# Patient Record
Sex: Male | Born: 2000 | Race: Black or African American | Hispanic: No | Marital: Single | Smoking: Never smoker
Health system: Southern US, Community
[De-identification: ages and names within clinical notes are randomized; demographics above are authoritative.]

## PROBLEM LIST (undated history)

## (undated) DIAGNOSIS — L509 Urticaria, unspecified: Secondary | ICD-10-CM

## (undated) HISTORY — DX: Urticaria, unspecified: L50.9

## (undated) HISTORY — PX: NO PAST SURGERIES: SHX2092

---
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
## 2020-01-17 ENCOUNTER — Other Ambulatory Visit: Payer: Self-pay | Admitting: Family Medicine

## 2020-02-18 ENCOUNTER — Other Ambulatory Visit: Payer: Self-pay | Admitting: Family Medicine

## 2020-02-18 NOTE — Telephone Encounter (Addendum)
Denied refill montelukast.  Patient needs OV.  Last ov 01/13/2019. Patient was given one refill of montelukast on 01/19/2020.  No OV made. Tried to call patient to schedule an OV.  No answer and no VM.

## 2020-05-28 ENCOUNTER — Encounter (HOSPITAL_BASED_OUTPATIENT_CLINIC_OR_DEPARTMENT_OTHER): Payer: Self-pay | Admitting: Emergency Medicine

## 2020-05-28 ENCOUNTER — Emergency Department (HOSPITAL_BASED_OUTPATIENT_CLINIC_OR_DEPARTMENT_OTHER)
Admission: EM | Admit: 2020-05-28 | Discharge: 2020-05-28 | Disposition: A | Discharge status: Home / Self Care | Payer: Medicaid Other | Attending: Emergency Medicine | Admitting: Emergency Medicine

## 2020-05-28 ENCOUNTER — Other Ambulatory Visit: Payer: Self-pay

## 2020-05-28 ENCOUNTER — Emergency Department (HOSPITAL_BASED_OUTPATIENT_CLINIC_OR_DEPARTMENT_OTHER): Payer: Medicaid Other

## 2020-05-28 DIAGNOSIS — R0789 Other chest pain: Secondary | ICD-10-CM | POA: Diagnosis present

## 2020-05-28 MED ORDER — KETOROLAC TROMETHAMINE 60 MG/2ML IM SOLN
60.0000 mg | Freq: Once | INTRAMUSCULAR | Status: AC
  Administered 2020-05-28: 60 mg via INTRAMUSCULAR
  Filled 2020-05-28: qty 2

## 2020-05-28 MED ORDER — SODIUM CHLORIDE 0.9% FLUSH
3.0000 mL | Freq: Once | INTRAVENOUS | Status: DC
  Filled 2020-05-28: qty 3

## 2020-05-28 MED ORDER — IBUPROFEN 800 MG PO TABS: 800.0000 mg | ORAL_TABLET | Freq: Once | ORAL | Status: DC

## 2020-05-28 NOTE — ED Triage Notes (Signed)
Progressively worse left sided rib/chest pain since yesterday. Denies trauma. States sudden onset while resting. Increased pain with inspiration.

## 2020-05-28 NOTE — ED Notes (Signed)
Declined IV and blood work at this time

## 2020-05-28 NOTE — ED Provider Notes (Signed)
TIME SEEN: 4:33 AM  CHIEF COMPLAINT: Left-sided chest pain  HPI: Patient is an 19 year old male who presents to the emergency department left-sided chest pain that started today.  Describes it as sharp and is uncomfortable with movement and palpation.  No shortness of breath, fevers, cough, lower extremity swelling or pain.  No history of PE, DVT, exogenous estrogen use, recent fractures, surgery, trauma, hospitalization or prolonged travel. No lower extremity swelling or pain. No calf tenderness.  Mother reports he has had this before and was told it was musculoskeletal pain.  ROS: See HPI Constitutional: no fever  Eyes: no drainage  ENT: no runny nose   Cardiovascular:   chest pain  Resp: no SOB  GI: no vomiting GU: no dysuria Integumentary: no rash  Allergy: no hives  Musculoskeletal: no leg swelling  Neurological: no slurred speech ROS otherwise negative  PAST MEDICAL HISTORY/PAST SURGICAL HISTORY:  Past Medical History:  Diagnosis Date  . Urticaria     MEDICATIONS:  Prior to Admission medications   Medication Sig Start Date End Date Taking? Authorizing Provider  Albuterol Sulfate 108 (90 Base) MCG/ACT AEPB Inhale 2 puffs into the lungs every 4 (four) hours as needed.    [provider]  cetirizine (ZYRTEC) 10 MG tablet Take 10 mg by mouth daily as needed.    [provider]  montelukast (SINGULAIR) 10 MG tablet TAKE 1 TABLET(10 MG) BY MOUTH AT BEDTIME 01/19/20   Ambs, Kathrine Cords, FNP    ALLERGIES:  No Known Allergies  SOCIAL HISTORY:  Social History   Tobacco Use  . Smoking status: Never Smoker  . Smokeless tobacco: Never Used  Substance Use Topics  . Alcohol use: No    FAMILY HISTORY: Family History  Problem Relation Age of Onset  . Allergic rhinitis Neg Hx   . Angioedema Neg Hx   . Asthma Neg Hx   . Eczema Neg Hx   . Immunodeficiency Neg Hx   . Urticaria Neg Hx     EXAM: BP 128/85 (BP Location: Left Arm)   Pulse (!) 53   Temp 98.1 F  (36.7 C)   Resp 18   Ht 5\' 8"  (1.727 m)   SpO2 100%  CONSTITUTIONAL: Alert and oriented and responds appropriately to questions. Well-appearing; well-nourished HEAD: Normocephalic EYES: Conjunctivae clear, pupils appear equal, EOM appear intact ENT: normal nose; moist mucous membranes NECK: Supple, normal ROM CARD: RRR; S1 and S2 appreciated; no murmurs, no clicks, no rubs, no gallops CHEST:  Chest wall is tender to palpation over the left anterior chest wall which reproduces his pain.  No crepitus, ecchymosis, erythema, warmth, rash or other lesions present.   RESP: Normal chest excursion without splinting or tachypnea; breath sounds clear and equal bilaterally; no wheezes, no rhonchi, no rales, no hypoxia or respiratory distress, speaking full sentences ABD/GI: Normal bowel sounds; non-distended; soft, non-tender, no rebound, no guarding, no peritoneal signs, no hepatosplenomegaly BACK:  The back appears normal EXT: Normal ROM in all joints; no deformity noted, no edema; no cyanosis SKIN: Normal color for age and race; warm; no rash on exposed skin NEURO: Moves all extremities equally PSYCH: The patient's mood and manner are appropriate.   MEDICAL DECISION MAKING: Patient here with chest wall pain.  Doubt ACS.  EKG reviewed/interpreted and is reassuring without signs of ischemia.  He is PERC negative.  Chest x-ray reviewed/interpreted and shows no pneumothorax, rib fracture, edema, consolidation.  Doubt dissection.  Doubt pericarditis, myocarditis, endocarditis.  Given Toradol here and  appears more comfortable.  Recommended alternating ibuprofen and Tylenol at home.  Recommended decreased exercise over the next several days until symptoms have improved.  Mother suspects that working out, push-ups likely contributed to his discomfort.  At this time, I do not feel there is any life-threatening condition present. I have reviewed, interpreted and discussed all results (EKG, imaging, lab, urine as  appropriate) and exam findings with patient/family. I have reviewed nursing notes and appropriate previous records.  I feel the patient is safe to be discharged home without further emergent workup and can continue workup as an outpatient as needed. Discussed usual and customary return precautions. Patient/family verbalize understanding and are comfortable with this plan.  Outpatient follow-up has been provided as needed. All questions have been answered.     EKG Interpretation  Date/Time:  Friday May 28 2020 03:56:02 EDT Ventricular Rate:  62 PR Interval:  148 QRS Duration: 94 QT Interval:  390 QTC Calculation: 395 R Axis:   85 Text Interpretation: Normal sinus rhythm with sinus arrhythmia Early repolarization Normal ECG No old tracing to compare Confirmed by Aashi Derrington, Baxter Hire [id number]) on 05/28/2020 4:07:39 AM          Mariana Kaufman Kimmons was evaluated in Emergency Department on 05/28/2020 for the symptoms described in the history of present illness. She was evaluated in the context of the global COVID-19 pandemic, which necessitated consideration that the patient might be at risk for infection with the SARS-CoV-2 virus that causes COVID-19. Institutional protocols and algorithms that pertain to the evaluation of patients at risk for COVID-19 are in a state of rapid change based on information released by regulatory bodies including the CDC and federal and state organizations. These policies and algorithms were followed during the patient's care in the ED.      Icelyn Navarrete, Layla Maw, DO 05/28/20 [phone]

## 2020-05-28 NOTE — Discharge Instructions (Addendum)
You may alternate Tylenol 1000 mg every 6 hours as needed for pain and Ibuprofen 800 mg every 8 hours as needed for pain.  Please take Ibuprofen with food.  Do not take more than 4000 mg of Tylenol (acetaminophen) in a 24 hour period.  

## 2021-03-23 ENCOUNTER — Encounter (HOSPITAL_BASED_OUTPATIENT_CLINIC_OR_DEPARTMENT_OTHER): Payer: Self-pay | Admitting: Emergency Medicine

## 2021-03-23 ENCOUNTER — Emergency Department (HOSPITAL_BASED_OUTPATIENT_CLINIC_OR_DEPARTMENT_OTHER)
Admission: EM | Admit: 2021-03-23 | Discharge: 2021-03-23 | Disposition: A | Discharge status: Home / Self Care | Payer: Medicaid Other | Attending: Emergency Medicine | Admitting: Emergency Medicine

## 2021-03-23 ENCOUNTER — Other Ambulatory Visit: Payer: Self-pay

## 2021-03-23 DIAGNOSIS — J039 Acute tonsillitis, unspecified: Secondary | ICD-10-CM | POA: Insufficient documentation

## 2021-03-23 DIAGNOSIS — R070 Pain in throat: Secondary | ICD-10-CM | POA: Diagnosis present

## 2021-03-23 DIAGNOSIS — J45909 Unspecified asthma, uncomplicated: Secondary | ICD-10-CM | POA: Insufficient documentation

## 2021-03-23 MED ORDER — CLINDAMYCIN HCL 300 MG PO CAPS: 300.0000 mg | ORAL_CAPSULE | Freq: Four times a day (QID) | ORAL | 0 refills | Status: AC

## 2021-03-23 MED ORDER — IBUPROFEN 400 MG PO TABS
400.0000 mg | ORAL_TABLET | Freq: Once | ORAL | Status: AC
  Administered 2021-03-23: 400 mg via ORAL
  Filled 2021-03-23: qty 1

## 2021-03-23 MED ORDER — CLINDAMYCIN HCL 150 MG PO CAPS
300.0000 mg | ORAL_CAPSULE | Freq: Once | ORAL | Status: AC
  Administered 2021-03-23: 300 mg via ORAL
  Filled 2021-03-23: qty 2

## 2021-03-23 NOTE — ED Notes (Signed)
Patient complains of sore throat for about two weeks.  CO difficulty swallowing.

## 2021-03-23 NOTE — ED Triage Notes (Signed)
Patient arrived c/o sore throat x 2 weeks intermittently. Patient states that it hurts to swallow. Patient is AO x 4, VS WDL, normal gait.

## 2021-03-23 NOTE — ED Provider Notes (Signed)
[facility] EMERGENCY DEPARTMENT Provider Note   CSN: [id number] Arrival date & time: 03/23/21  0008     History Chief Complaint  Patient presents with  . Sore Throat    Walter Donovan is a 20 y.o. adult.  Has been on Magic mouthwash and doxycycline however threw up the doxycycline.   Sore Throat This is a new problem. The current episode started more than 1 week ago. The problem occurs constantly. The problem has been gradually worsening. Pertinent negatives include no chest pain, no abdominal pain, no headaches and no shortness of breath. Nothing aggravates the symptoms. Nothing relieves the symptoms. She has tried nothing for the symptoms.       Past Medical History:  Diagnosis Date  . Urticaria     Patient Active Problem List   Diagnosis Date Noted  . Reactive airway disease 01/13/2019  . Seasonal and perennial allergic rhinitis 08/24/2017  . 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 NO PAST SURGERIES         Family History  Problem Relation Age of Onset  . Allergic rhinitis Neg Hx   . Angioedema Neg Hx   . Asthma Neg Hx   . Eczema Neg Hx   . Immunodeficiency Neg Hx   . Urticaria Neg Hx     Social History   Tobacco Use  . Smoking status: Never Smoker  . Smokeless tobacco: Never Used  Vaping Use  . Vaping Use: Never used  Substance Use Topics  . Alcohol use: No  . Drug use: Yes    Types: Marijuana    Home Medications Prior to Admission medications   Medication Sig Start Date End Date Taking? Authorizing Provider  clindamycin (CLEOCIN) 300 MG capsule Take 1 capsule (300 mg total) by mouth 4 (four) times daily. X 7 days 03/23/21  Yes Shronda Boeh, Barbara Cower, MD  Albuterol Sulfate 108 (90 Base) MCG/ACT AEPB Inhale 2 puffs into the lungs every 4 (four) hours as needed.    [provider]  cetirizine (ZYRTEC) 10 MG tablet Take 10 mg by mouth daily as needed.    [provider]  montelukast  (SINGULAIR) 10 MG tablet TAKE 1 TABLET(10 MG) BY MOUTH AT BEDTIME 01/19/20   Ambs, Norvel Richards, FNP    Allergies    Patient has no known allergies.  Review of Systems   Review of Systems  Respiratory: Negative for shortness of breath.   Cardiovascular: Negative for chest pain.  Gastrointestinal: Negative for abdominal pain.  Neurological: Negative for headaches.  All other systems reviewed and are negative.   Physical Exam Updated Vital Signs BP (!) 136/92 (BP Location: Right Arm)   Pulse 73   Temp 98.7 F (37.1 C) (Oral)   Resp 18   Ht 5\' 8"  (1.727 m)   Wt 74.8 kg   SpO2 99%   BMI 25.09 kg/m   Physical Exam Vitals and nursing note reviewed.  Constitutional:      Appearance: She is well-developed.  HENT:     Head: Normocephalic and atraumatic.     Mouth/Throat:     Mouth: Mucous membranes are moist.     Pharynx: Oropharynx is clear.     Comments: R>L tonsillar swelling. Uvula midline. No obvious abscess.  Eyes:     Pupils: Pupils are equal, round, and reactive to light.  Cardiovascular:     Rate and Rhythm: Normal rate.  Pulmonary:     Effort: Pulmonary effort is normal.  No respiratory distress.  Abdominal:     General: There is no distension.  Musculoskeletal:        General: Normal range of motion.     Cervical back: Normal range of motion.  Skin:    General: Skin is warm and dry.  Neurological:     General: No focal deficit present.     Mental Status: She is alert.     ED Results / Procedures / Treatments   Labs (all labs ordered are listed, but only abnormal results are displayed) Labs Reviewed - No data to display  EKG None  Radiology No results found.  Procedures Procedures   Medications Ordered in ED Medications  clindamycin (CLEOCIN) capsule 300 mg (300 mg Oral Given 03/23/21 0050)  ibuprofen (ADVIL) tablet 400 mg (400 mg Oral Given 03/23/21 0050)    ED Course  I have reviewed the triage vital signs and the nursing notes.  Pertinent labs  & imaging results that were available during my care of the patient were reviewed by me and considered in my medical decision making (see chart for details).    MDM Rules/Calculators/A&P                          Suspect asymmetric tonsillitis. No drainable abscess is evident. Wide open airway.  Tolerating PO. Will advise continued Magic mouthwash, antibiotics and ENT follow-up in 3 to 5 days to ensure improvement.  Return here for any new or worsening symptoms.  Final Clinical Impression(s) / ED Diagnoses Final diagnoses:  Tonsillitis    Rx / DC Orders ED Discharge Orders         Ordered    clindamycin (CLEOCIN) 300 MG capsule  4 times daily        03/23/21 0106           Freda Jaquith, Barbara Cower, MD 03/23/21 [phone]

## 2021-10-28 ENCOUNTER — Emergency Department (HOSPITAL_BASED_OUTPATIENT_CLINIC_OR_DEPARTMENT_OTHER): Payer: Medicaid Other

## 2021-10-28 ENCOUNTER — Other Ambulatory Visit: Payer: Self-pay

## 2021-10-28 ENCOUNTER — Encounter (HOSPITAL_BASED_OUTPATIENT_CLINIC_OR_DEPARTMENT_OTHER): Payer: Self-pay

## 2021-10-28 ENCOUNTER — Emergency Department (HOSPITAL_BASED_OUTPATIENT_CLINIC_OR_DEPARTMENT_OTHER)
Admission: EM | Admit: 2021-10-28 | Discharge: 2021-10-28 | Disposition: A | Discharge status: Home / Self Care | Payer: Medicaid Other | Attending: Emergency Medicine | Admitting: Emergency Medicine

## 2021-10-28 DIAGNOSIS — D72829 Elevated white blood cell count, unspecified: Secondary | ICD-10-CM | POA: Insufficient documentation

## 2021-10-28 DIAGNOSIS — R0789 Other chest pain: Secondary | ICD-10-CM | POA: Insufficient documentation

## 2021-10-28 LAB — CBC
HCT: 44.9 % (ref 39.0–52.0)
Hemoglobin: 16.7 g/dL (ref 13.0–17.0)
MCH: 31.9 pg (ref 26.0–34.0)
MCHC: 37.2 g/dL — ABNORMAL HIGH (ref 30.0–36.0)
MCV: 85.7 fL (ref 80.0–100.0)
Platelets: 313 10*3/uL (ref 150–400)
RBC: 5.24 MIL/uL (ref 4.22–5.81)
RDW: 11.9 % (ref 11.5–15.5)
WBC: 13.2 10*3/uL — ABNORMAL HIGH (ref 4.0–10.5)
nRBC: 0 % (ref 0.0–0.2)

## 2021-10-28 LAB — BASIC METABOLIC PANEL
Anion gap: 8 (ref 5–15)
BUN: 11 mg/dL (ref 6–20)
CO2: 29 mmol/L (ref 22–32)
Calcium: 9.5 mg/dL (ref 8.9–10.3)
Chloride: 101 mmol/L (ref 98–111)
Creatinine, Ser: 1.25 mg/dL — ABNORMAL HIGH (ref 0.61–1.24)
GFR, Estimated: >60 mL/min (ref >60)
Glucose, Bld: 99 mg/dL (ref 70–99)
Potassium: 3.7 mmol/L (ref 3.5–5.1)
Sodium: 138 mmol/L (ref 135–145)

## 2021-10-28 LAB — TROPONIN I (HIGH SENSITIVITY): Troponin I (High Sensitivity): <2 ng/L (ref <18)

## 2021-10-28 MED ORDER — DEXAMETHASONE SODIUM PHOSPHATE 4 MG/ML IJ SOLN
4.0000 mg | Freq: Once | INTRAMUSCULAR | Status: AC
  Administered 2021-10-28: 4 mg via INTRAMUSCULAR
  Filled 2021-10-28: qty 1

## 2021-10-28 NOTE — Discharge Instructions (Signed)
Seems to be chest wall in nature.  Hopefully the Decadron shot will help.  In addition you can take Motrin 800 mg every 8 hours as well.  Make an appointment follow-up with sports medicine.  Work-up here today EKG heart markers chest x-ray all negative.  Return for any new or worse symptoms.

## 2021-10-28 NOTE — ED Provider Notes (Signed)
[facility] EMERGENCY DEPARTMENT Provider Note   CSN: [id number] Arrival date & time: 10/28/21  1821     History Chief Complaint  Patient presents with   Chest Pain    Walter Donovan is a 20 y.o. adult.  Patient here for left anterior chest wall pain.  Symptoms have been ongoing for about a week.  Its intermittent.  Made worse by taking deep breaths.  There is some discomfort to palpation to the area.  And sometimes with movement of the trunk but does not happen with movement of the left upper extremity.  Approximately 2 years ago had something similar happen which was improved by a steroid shot.  Patient does not have any upper respiratory type symptoms.  Patient in triage when they drew his blood did have a brief syncopal episode sounds as if it was vasovagal.  Patient feels fine now.  Patient denies any shortness of breath.  Patient denies any injuries.  Does not do any weight lifting.      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 Authorizing Provider  Albuterol Sulfate 108 (90 Base) MCG/ACT AEPB Inhale 2 puffs into the lungs every 4 (four) hours as needed.    [provider]  cetirizine (ZYRTEC)  10 MG tablet Take 10 mg by mouth daily as needed.    [provider]  clindamycin (CLEOCIN) 300 MG capsule Take 1 capsule (300 mg total) by mouth 4 (four) times daily. X 7 days 03/23/21   Mesner, Corene Cornea, MD  montelukast (SINGULAIR) 10 MG tablet TAKE 1 TABLET(10 MG) BY MOUTH AT BEDTIME 01/19/20   Ambs, Kathrine Cords, FNP    Allergies    Patient has no known allergies.  Review of Systems   Review of Systems  Constitutional:  Negative for chills and fever.  HENT:  Negative for ear pain and sore throat.   Eyes:  Negative for pain and visual disturbance.  Respiratory:  Negative for cough and shortness of breath.   Cardiovascular:  Positive for chest pain. Negative for palpitations.  Gastrointestinal:  Negative for abdominal pain and vomiting.  Genitourinary:  Negative for dysuria and hematuria.  Musculoskeletal:  Negative for arthralgias and back pain.  Skin:  Negative for color change and rash.  Neurological:  Negative for seizures and syncope.  All other systems reviewed and are negative.  Physical Exam Updated Vital Signs BP 117/67   Pulse 83   Temp 98.6 F (37 C) (Oral)   Resp 19   Ht 1.727 m (  5\' 8" )   Wt 87.5 kg   SpO2 99%   BMI 29.35 kg/m   Physical Exam Vitals and nursing note reviewed.  Constitutional:      General: She is not in acute distress.    Appearance: Normal appearance. She is well-developed.  HENT:     Head: Normocephalic and atraumatic.  Eyes:     Extraocular Movements: Extraocular movements intact.     Conjunctiva/sclera: Conjunctivae normal.     Pupils: Pupils are equal, round, and reactive to light.  Cardiovascular:     Rate and Rhythm: Normal rate and regular rhythm.     Heart sounds: No murmur heard. Pulmonary:     Effort: Pulmonary effort is normal. No respiratory distress.     Breath sounds: Normal breath sounds. No stridor. No wheezing, rhonchi or rales.     Comments: Tender to palpation actually below the pectoralis muscle anteriorly midline.   Midline but the clavicle.  Over the ribs which would kind of be the seventh or eighth rib area. Chest:     Chest wall: Tenderness present.  Abdominal:     Palpations: Abdomen is soft.     Tenderness: There is no abdominal tenderness.  Musculoskeletal:        General: Normal range of motion.     Cervical back: Normal range of motion and neck supple.  Skin:    General: Skin is warm and dry.  Neurological:     General: No focal deficit present.     Mental Status: She is alert and oriented to person, place, and time.     Cranial Nerves: No cranial nerve deficit.     Sensory: No sensory deficit.     Motor: No weakness.    ED Results / Procedures / Treatments   Labs (all labs ordered are listed, but only abnormal results are displayed) Labs Reviewed  BASIC METABOLIC PANEL - Abnormal; Notable for the following components:      Result Value   Creatinine, Ser 1.25 (*)    All other components within normal limits  CBC - Abnormal; Notable for the following components:   WBC 13.2 (*)    MCHC 37.2 (*)    All other components within normal limits  TROPONIN I (HIGH SENSITIVITY)  TROPONIN I (HIGH SENSITIVITY)    EKG EKG Interpretation  Date/Time:  Friday October 28 2021 18:55:36 EDT Ventricular Rate:  71 PR Interval:  142 QRS Duration: 86 QT Interval:  336 QTC Calculation: 365 R Axis:   86 Text Interpretation: Normal sinus rhythm Normal ECG Confirmed by 1972 [phone]) on 10/28/2021 9:23:20 PM  Radiology DG Chest Portable 1 View  Result Date: 10/28/2021 CLINICAL DATA:  Intermittent chest pain for 2 weeks. Productive cough for 5 days. EXAM: PORTABLE CHEST 1 VIEW COMPARISON:  05/28/2020 FINDINGS: The heart size and mediastinal contours are within normal limits. Both lungs are clear. The visualized skeletal structures are unremarkable. IMPRESSION: No active disease. Electronically Signed   By: 07/28/2020 M.D.   On: 10/28/2021 20:23    Procedures Procedures    Medications Ordered in ED Medications  dexamethasone (DECADRON) injection 4 mg (4 mg Intramuscular Given 10/28/21 2114)    ED Course  I have reviewed the triage vital signs and the nursing notes.  Pertinent labs & imaging results that were available during my care of the patient were reviewed by me and considered in my medical decision making (see chart for details).    MDM Rules/Calculators/A&P  Symptoms very chest wall in nature.  Patient without any hypoxia not tachycardic.  EKG normal sinus rhythm.  Initial troponin normal.  Delta troponin not really required.  Chest x-ray negative.  Seems to be chest wall in nature.  Mild leukocytosis.  Basic metabolic panel is normal.  Patient stated that the steroid injection has helped in the past.  Did not want just oral steroids.  So we will give some IM Decadron here.  Given referral to follow-up with sports medicine.  Patient will return for any new or worse symptoms.   Final Clinical Impression(s) / ED Diagnoses Final diagnoses:  Chest wall pain    Rx / DC Orders ED Discharge Orders     None        Fredia Sorrow, MD 10/28/21 2125

## 2021-10-28 NOTE — ED Notes (Signed)
Pt refused nasal swab for flu/covid-agreed to EKG, CXR , blood work

## 2021-10-28 NOTE — ED Notes (Signed)
Patient denies pain and is resting comfortably.  

## 2021-10-28 NOTE — ED Triage Notes (Signed)
Pt c/o intermittent CP x 2 weeks-prod cough x 5 days-denies fever-NAD-steady gait

## 2021-10-28 NOTE — ED Notes (Signed)
After blood draw-pt became pale, diaphoretic, brief syncopal episode-pt stood from chair to stretcher-placed in supine position-back to baseline-VSS-given water-back to w/c and to ED WR

## 2022-07-08 IMAGING — DX DG CHEST 1V PORT
1 series · 1 of 1 positions shown · non-contrast
Comparison: 05/28/2020

CLINICAL DATA: Intermittent chest pain for 2 weeks. Productive
cough for 5 days.

EXAM:
PORTABLE CHEST 1 VIEW

[chest ap]
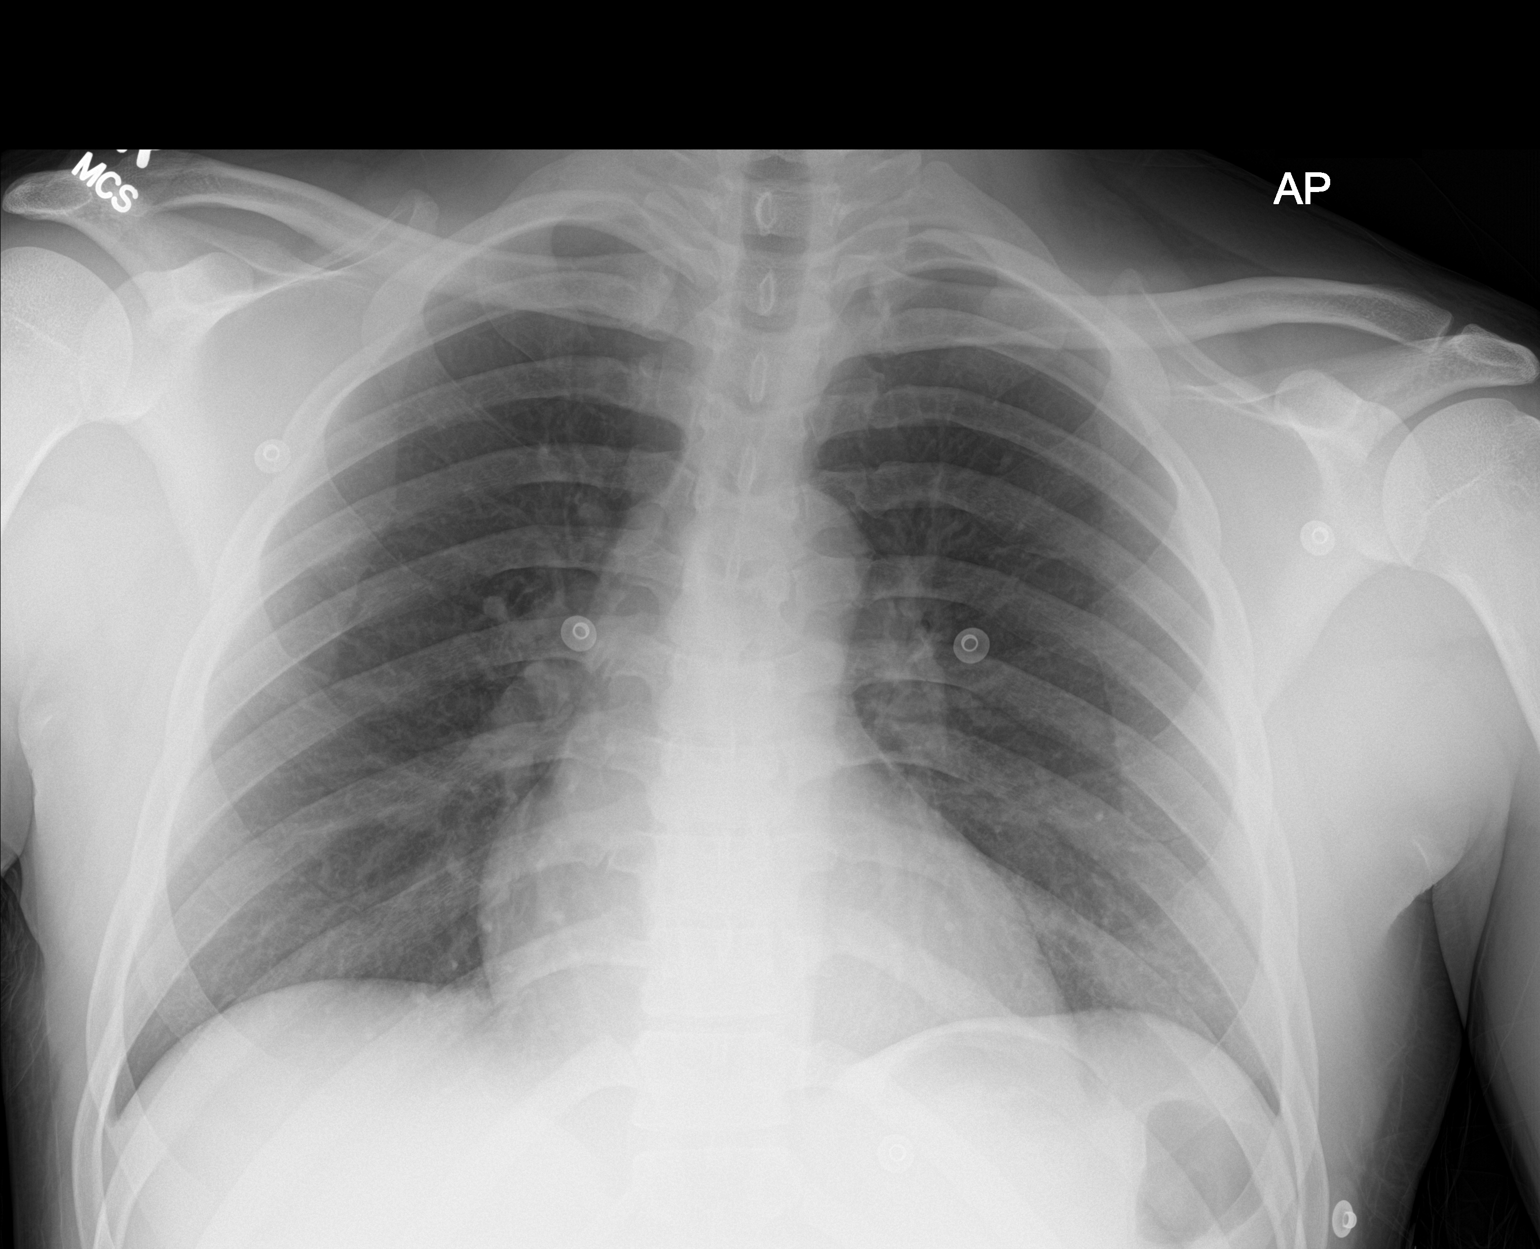

[1 of 1 positions shown; findings below may reference images not displayed]

FINDINGS: The heart size and mediastinal contours are within normal limits.
Both lungs are clear. The visualized skeletal structures are
unremarkable.
IMPRESSION: No active disease.

## 2022-09-16 ENCOUNTER — Encounter (HOSPITAL_BASED_OUTPATIENT_CLINIC_OR_DEPARTMENT_OTHER): Payer: Self-pay | Admitting: Emergency Medicine

## 2022-09-16 ENCOUNTER — Other Ambulatory Visit: Payer: Self-pay

## 2022-09-16 ENCOUNTER — Emergency Department (HOSPITAL_BASED_OUTPATIENT_CLINIC_OR_DEPARTMENT_OTHER)
Admission: EM | Admit: 2022-09-16 | Discharge: 2022-09-16 | Disposition: A | Discharge status: Home / Self Care | Payer: Medicaid Other | Attending: Emergency Medicine | Admitting: Emergency Medicine

## 2022-09-16 DIAGNOSIS — J029 Acute pharyngitis, unspecified: Secondary | ICD-10-CM | POA: Diagnosis present

## 2022-09-16 DIAGNOSIS — Z20822 Contact with and (suspected) exposure to covid-19: Secondary | ICD-10-CM | POA: Insufficient documentation

## 2022-09-16 DIAGNOSIS — J45909 Unspecified asthma, uncomplicated: Secondary | ICD-10-CM | POA: Insufficient documentation

## 2022-09-16 LAB — SARS CORONAVIRUS 2 BY RT PCR: SARS Coronavirus 2 by RT PCR: NEGATIVE

## 2022-09-16 LAB — GROUP A STREP BY PCR: Group A Strep by PCR: NOT DETECTED

## 2022-09-16 MED ORDER — DEXAMETHASONE SODIUM PHOSPHATE 10 MG/ML IJ SOLN
10.0000 mg | Freq: Once | INTRAMUSCULAR | Status: AC
  Administered 2022-09-16: 10 mg
  Filled 2022-09-16: qty 1

## 2022-09-16 MED ORDER — IBUPROFEN 400 MG PO TABS
600.0000 mg | ORAL_TABLET | Freq: Once | ORAL | Status: AC
  Administered 2022-09-16: 600 mg via ORAL
  Filled 2022-09-16: qty 1

## 2022-09-16 MED ORDER — DEXAMETHASONE SODIUM PHOSPHATE 10 MG/ML IJ SOLN: 10.0000 mg | Freq: Once | INTRAMUSCULAR | Status: DC

## 2022-09-16 NOTE — ED Provider Notes (Signed)
[facility] EMERGENCY DEPARTMENT Provider Note   CSN: [id number] Arrival date & time: 09/16/22  1116     History  Chief Complaint  Patient presents with   Sore Throat   URI    Walter Donovan is a 21 y.o. adult.  With PMH of reactive airway disease who presents with viral URI symptoms and sore throat for 2 days.  Patient said he started having a sore throat for the past 2 days.  It does hurt with swallowing but he is able to swallow both solids and liquids and maintain his secretions with no drooling.  He has had no cough, no chest pain, no shortness of breath, no wheezing.  He has been taking over-the-counter flu medicine without relief.  He denies any new sexual partners and denies any concern for oral STD.  He has had some associated congestion and rhinorrhea.   Sore Throat  URI      Home Medications Prior to Admission medications   Medication Sig Start Date End Date Taking? Authorizing Provider  Albuterol Sulfate 108 (90 Base) MCG/ACT AEPB Inhale 2 puffs into the lungs every 4 (four) hours as needed.    [provider]  cetirizine (ZYRTEC) 10 MG tablet Take 10 mg by mouth daily as needed.    [provider]  clindamycin (CLEOCIN) 300 MG capsule Take 1 capsule (300 mg total) by mouth 4 (four) times daily. X 7 days 03/23/21   Mesner, Corene Cornea, MD  montelukast (SINGULAIR) 10 MG tablet TAKE 1 TABLET(10 MG) BY MOUTH AT BEDTIME 01/19/20   Ambs, Kathrine Cords, FNP      Allergies    Patient has no known allergies.    Review of Systems   Review of Systems  Physical Exam Updated Vital Signs BP (!) 131/90 (BP Location: Right Arm)   Pulse 98   Temp (!) 100.4 F (38 C) (Oral)   Resp 18   Ht 5\' 8"  (1.727 m)   Wt 79.4 kg   SpO2 95%   BMI 26.61 kg/m  Physical Exam Constitutional: Alert and oriented. Well appearing and in no distress. Eyes: Conjunctivae are normal. ENT      Head: Normocephalic and atraumatic.      Nose: No congestion.      Mouth/Throat:  Mucous membranes are moist.  Bilateral tonsillar edema with exudates present, uvula midline.      Neck: No stridor.  No tripoding.  No drooling. Cardiovascular: S1, S2, regular rate and rhythm Respiratory: Normal respiratory effort. Breath sounds are normal.  O2 sat 95 on RA Gastrointestinal: Soft and nontender.  No palpable splenomegaly Musculoskeletal: Normal range of motion in all extremities. Neurologic: Normal speech and language. No gross focal neurologic deficits are appreciated. Skin: Skin is warm, dry and intact. No rash noted. Psychiatric: Mood and affect are normal. Speech and behavior are normal.  ED Results / Procedures / Treatments   Labs (all labs ordered are listed, but only abnormal results are displayed) Labs Reviewed  SARS CORONAVIRUS 2 BY RT PCR  GROUP A STREP BY PCR  CULTURE, GROUP A STREP [facility])    EKG None  Radiology No results found.  Procedures Procedures   Medications Ordered in ED Medications  ibuprofen (ADVIL) tablet 600 mg (600 mg Oral Given 09/16/22 1228)  dexamethasone (DECADRON) injection 10 mg (10 mg Other Given 09/16/22 1228)    ED Course/ Medical Decision Making/ A&P  Medical Decision Making  Walter Donovan is a 21 y.o. adult.  With PMH of reactive airway disease who presents with viral URI symptoms and sore throat for 2 days.   Patient's presentation is most consistent with either a viral or bacterial pharyngitis.  Patient has evidence of bilateral tonsillar swelling with exudate which would argue against peritonsillar abscess.  Given the evident physical exam features, overall clinical time course, no difficulty handling secretions, or respiratory distress and overall nontoxic appearance, I do not feel that epiglottitis or a more severe soft tissue neck infection is the reason for presentation.  There is no evidence of Ludwig's angina, or other odontogenic or periodontal infection at this time.  He is denying  any new sexual encounters or concern for STD and does not want STD testing today.  He is scared of needles and shots and does not want mononucleosis testing.  His strep test was negative by rapid PCR and COVID test negative.  We have sent out strep culture and I advised any contact sports or injuries over the next couple of weeks in case he is mononucleosis positive.  He was given Decadron in the ED and ibuprofen and advised continued supportive care with Tylenol and ibuprofen and strict return precautions.  He is safe for discharge and follow up PCP.   Risk Prescription drug management.    Final Clinical Impression(s) / ED Diagnoses Final diagnoses:  Acute pharyngitis, unspecified etiology    Rx / DC Orders ED Discharge Orders     None         Mardene Sayer, MD 09/16/22 1245

## 2022-09-16 NOTE — Discharge Instructions (Addendum)
Today your strep and COVID test were negative.  You will be called if your culture is positive for strep and send antibiotics.  Take Tylenol 1 g every 6-8 hours and ibuprofen 600 mg every 4-6 hours for pain.  You were also given steroids in the ER today which will help with your pain, inflammation and swelling.  As we discussed, mononucleosis can be a common cause of sore throat so avoid any contact sports or getting hit in the abdomen for the next couple of weeks.  Come back for any severe worsening pain, difficulty breathing, difficulty swallowing fluids or drooling, or any other symptoms concerning to you

## 2022-09-16 NOTE — ED Triage Notes (Signed)
Patient c/o cold symptoms and sore throat onset 2 days ago.

## 2022-09-18 LAB — CULTURE, GROUP A STREP (THRC)

## 2024-05-06 ENCOUNTER — Other Ambulatory Visit: Payer: Self-pay

## 2024-05-06 ENCOUNTER — Encounter (HOSPITAL_BASED_OUTPATIENT_CLINIC_OR_DEPARTMENT_OTHER): Payer: Self-pay | Admitting: Urology

## 2024-05-06 ENCOUNTER — Emergency Department (HOSPITAL_BASED_OUTPATIENT_CLINIC_OR_DEPARTMENT_OTHER)
Admission: EM | Admit: 2024-05-06 | Discharge: 2024-05-06 | Disposition: A | Discharge status: Home / Self Care | Attending: Emergency Medicine | Admitting: Emergency Medicine

## 2024-05-06 DIAGNOSIS — Z113 Encounter for screening for infections with a predominantly sexual mode of transmission: Secondary | ICD-10-CM | POA: Insufficient documentation

## 2024-05-06 LAB — URINALYSIS, ROUTINE W REFLEX MICROSCOPIC
Bilirubin Urine: NEGATIVE
Glucose, UA: NEGATIVE mg/dL
Hgb urine dipstick: NEGATIVE
Ketones, ur: NEGATIVE mg/dL
Leukocytes,Ua: NEGATIVE
Nitrite: NEGATIVE
Protein, ur: NEGATIVE mg/dL
Specific Gravity, Urine: >=1.03 (ref 1.005–1.030)
pH: 5.5 (ref 5.0–8.0)

## 2024-05-06 LAB — PREGNANCY, URINE: Preg Test, Ur: NEGATIVE

## 2024-05-06 NOTE — Discharge Instructions (Addendum)
 You will be notified if your test indicates additional treatment is needed. Testing can also be performed at the local health department.

## 2024-05-06 NOTE — ED Triage Notes (Signed)
 Pt wants to be tested for STDs,  " a check up"  No symptoms  Also has some dry patches of skin on arms that he wants checked

## 2024-05-06 NOTE — ED Notes (Signed)
 States does not want blood draw, just wants urine testing for GC/Chlam

## 2024-05-06 NOTE — ED Provider Notes (Signed)
 [place] [facility] Provider Note   CSN: [id number] Arrival date & time: 05/06/24  1743     History  Chief Complaint  Patient presents with   SEXUALLY TRANSMITTED DISEASE    Walter Donovan is a 23 y.o. adult.  Patient presents for STD check. Patient is occasionally sexually active, predominantly with protection. No current symptoms. Just wants to be screened for chlamydia and gonorrhea.   The history is provided by the patient. No language interpreter was used.       Home Medications Prior to Admission medications   Medication Sig Start Date End Date Taking? Authorizing Provider  Albuterol  Sulfate 108 (90 Base) MCG/ACT AEPB Inhale 2 puffs into the lungs every 4 (four) hours as needed.    [provider]  cetirizine (ZYRTEC) 10 MG tablet Take 10 mg by mouth daily as needed.    [provider]  clindamycin  (CLEOCIN ) 300 MG capsule Take 1 capsule (300 mg total) by mouth 4 (four) times daily. X 7 days 03/23/21   Mesner, Reymundo Caulk, MD  montelukast  (SINGULAIR ) 10 MG tablet TAKE 1 TABLET(10 MG) BY MOUTH AT BEDTIME 01/19/20   Ambs, Jeanmarie Millet, FNP      Allergies    Patient has no known allergies.    Review of Systems   Review of Systems  All other systems reviewed and are negative.   Physical Exam Updated Vital Signs BP 128/83 (BP Location: Right Arm)   Pulse 84   Temp (!) 97.5 F (36.4 C)   Resp 18   Ht 5\' 8"  (1.727 m)   Wt 81.6 kg   SpO2 97%   BMI 27.37 kg/m  Physical Exam Constitutional:      Appearance: Normal appearance.  Eyes:     Conjunctiva/sclera: Conjunctivae normal.  Cardiovascular:     Rate and Rhythm: Normal rate.  Pulmonary:     Effort: Pulmonary effort is normal.  Musculoskeletal:        General: Normal range of motion.  Skin:    General: Skin is warm and dry.  Neurological:     Mental Status: She is alert and oriented to person, place, and time.  Psychiatric:        Mood and Affect: Mood  normal.        Behavior: Behavior normal.     ED Results / Procedures / Treatments   Labs (all labs ordered are listed, but only abnormal results are displayed) Labs Reviewed  URINALYSIS, ROUTINE W REFLEX MICROSCOPIC  PREGNANCY, URINE  GC/CHLAMYDIA PROBE AMP ([place]) NOT AT [facility]    EKG None  Radiology No results found.  Procedures Procedures    Medications Ordered in ED Medications - No data to display  ED Course/ Medical Decision Making/ A&P                                 Medical Decision Making Amount and/or Complexity of Data Reviewed Labs: ordered.   Pt arrives for asymptomatic STD check. Initial testing is negative for trichomonas. Discussed safe sexual practices. Pt is advised to follow up for free testing at local health department in the future. Pt appears safe for discharge.          Final Clinical Impression(s) / ED Diagnoses Final diagnoses:  Screen for STD (sexually transmitted disease)    Rx / DC Orders ED Discharge Orders     None  Gigi Kyle, NP 05/06/24 2226    Dalene Duck, MD 05/06/24 [phone]

## 2024-05-08 LAB — GC/CHLAMYDIA PROBE AMP (~~LOC~~) NOT AT ARMC
Chlamydia: NEGATIVE
Comment: NEGATIVE
Comment: NORMAL
Neisseria Gonorrhea: NEGATIVE

## 2024-05-09 ENCOUNTER — Encounter (HOSPITAL_BASED_OUTPATIENT_CLINIC_OR_DEPARTMENT_OTHER): Payer: Self-pay | Admitting: Emergency Medicine

## 2024-05-09 ENCOUNTER — Emergency Department (HOSPITAL_BASED_OUTPATIENT_CLINIC_OR_DEPARTMENT_OTHER): Admission: EM | Admit: 2024-05-09 | Discharge: 2024-05-09 | Disposition: A | Discharge status: Home / Self Care

## 2024-05-09 ENCOUNTER — Other Ambulatory Visit: Payer: Self-pay

## 2024-05-09 DIAGNOSIS — Z202 Contact with and (suspected) exposure to infections with a predominantly sexual mode of transmission: Secondary | ICD-10-CM | POA: Diagnosis present

## 2024-05-09 LAB — HIV ANTIBODY (ROUTINE TESTING W REFLEX): HIV Screen 4th Generation wRfx: NONREACTIVE

## 2024-05-09 NOTE — ED Triage Notes (Signed)
 Pt wanting to be tested for HIV and Syphilis.  Pt was here recently and had urine test but not blood test.

## 2024-05-09 NOTE — ED Provider Notes (Signed)
  [place] [facility] Provider Note   CSN: [id number] Arrival date & time: 05/09/24  1753     History  Chief Complaint  Patient presents with   Exposure to STD    Sly Medor is a 23 y.o. adult.  23 year old male with no reported past medical history presenting to the emergency department today requesting screening for HIV and syphilis.  Patient states that he had a new partner that informed him that she had HSV-2.  He did have testing for gonorrhea and chlamydia earlier but did want to be tested for HIV and syphilis.  Patient is also requesting a genital exam but denies any known lesions or blisters consistent with HSV.  He denies any physical symptoms currently.   Exposure to STD       Home Medications Prior to Admission medications   Medication Sig Start Date End Date Taking? Authorizing Provider  Albuterol  Sulfate 108 (90 Base) MCG/ACT AEPB Inhale 2 puffs into the lungs every 4 (four) hours as needed.    [provider]  cetirizine (ZYRTEC) 10 MG tablet Take 10 mg by mouth daily as needed.    [provider]  clindamycin  (CLEOCIN ) 300 MG capsule Take 1 capsule (300 mg total) by mouth 4 (four) times daily. X 7 days 03/23/21   Mesner, Reymundo Caulk, MD  montelukast  (SINGULAIR ) 10 MG tablet TAKE 1 TABLET(10 MG) BY MOUTH AT BEDTIME 01/19/20   Ambs, Jeanmarie Millet, FNP      Allergies    Patient has no known allergies.    Review of Systems   Review of Systems  All other systems reviewed and are negative.   Physical Exam Updated Vital Signs BP 127/80   Pulse 84   Temp 98 F (36.7 C) (Oral)   Resp 16   Ht 5\' 8"  (1.727 m)   Wt 81.6 kg   SpO2 96%   BMI 27.37 kg/m  Physical Exam Vitals and nursing note reviewed.   Gen: NAD Neck: trachea midline Resp: No respiratory distress GU: Normal-appearing circumcised male genitalia, no testicular swelling or tenderness noted, no vesicular or other lesions noted Psyc: acting  appropriately   ED Results / Procedures / Treatments   Labs (all labs ordered are listed, but only abnormal results are displayed) Labs Reviewed  RPR  HIV ANTIBODY (ROUTINE TESTING W REFLEX)    EKG None  Radiology No results found.  Procedures Procedures    Medications Ordered in ED Medications - No data to display  ED Course/ Medical Decision Making/ A&P                                 Medical Decision Making 23 year old male presents emergency department today requesting HIV and syphilis testing.  Will obtain labs here.  He does have MyChart.  Is encouraged to check up on this and 48 to 72 hours.  He will be discharged with return precautions.  He does not have any findings on external exam consistent with syphilis or HSV at this time.  Amount and/or Complexity of Data Reviewed Labs: ordered.           Final Clinical Impression(s) / ED Diagnoses Final diagnoses:  Possible exposure to STD    Rx / DC Orders ED Discharge Orders     None         Carin Charleston, MD 05/09/24 1925

## 2024-05-09 NOTE — Discharge Instructions (Signed)
 Please check on MyChart for your results in 48 to 72 hours.  Return to the ER for worsening symptoms.

## 2024-05-09 NOTE — ED Notes (Signed)
 D/c paperwork reviewed with pt, including follow up care.  All questions and/or concerns addressed at time of d/c.  No further needs expressed. . Pt verbalized understanding, Ambulatory without assistance to ED exit, NAD.

## 2024-05-10 LAB — RPR: RPR Ser Ql: NONREACTIVE
